# Patient Record
Sex: Male | Born: 1985 | Race: White | Hispanic: No | Marital: Married | Smoking: Never smoker
Health system: Southern US, Community
[De-identification: ages and names within clinical notes are randomized; demographics above are authoritative.]

## PROBLEM LIST (undated history)

## (undated) DIAGNOSIS — T7840XA Allergy, unspecified, initial encounter: Secondary | ICD-10-CM

## (undated) HISTORY — PX: FRACTURE SURGERY: SHX138

## (undated) HISTORY — DX: Allergy, unspecified, initial encounter: T78.40XA

---
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
## 2006-09-03 ENCOUNTER — Ambulatory Visit: Payer: Self-pay | Admitting: Family Medicine

## 2006-12-20 ENCOUNTER — Ambulatory Visit: Payer: Self-pay | Admitting: Family Medicine

## 2006-12-20 LAB — CONVERTED CEMR LAB
ALT: 264 units/L — ABNORMAL HIGH (ref 0–40)
AST: 182 units/L — ABNORMAL HIGH (ref 0–37)
Alkaline Phosphatase: 157 units/L — ABNORMAL HIGH (ref 39–117)
BUN: 8 mg/dL (ref 6–23)
Basophils Relative: 0.9 % (ref 0.0–1.0)
Calcium: 9.2 mg/dL (ref 8.4–10.5)
Eosinophil percent: 0.4 % (ref 0.0–5.0)
Eosinophils Relative: 0.4 % (ref 0.0–5.0)
GFR calc non Af Amer: 90 mL/min
Glomerular Filtration Rate, Af Am: 109 mL/min/{1.73_m2}
Lymphocytes Relative: 60.2 % — ABNORMAL HIGH (ref 12.0–46.0)
Platelets: 256 10*3/uL (ref 150–400)
Potassium: 3.5 meq/L (ref 3.5–5.1)
RBC: 3.61 M/uL — ABNORMAL LOW (ref 4.22–5.81)
RDW: 11.6 % (ref 11.5–14.6)
Total Bilirubin: 7.4 mg/dL — ABNORMAL HIGH (ref 0.3–1.2)
Total Protein: 7.1 g/dL (ref 6.0–8.3)
WBC: 12.7 10*3/uL — ABNORMAL HIGH (ref 4.5–10.5)

## 2006-12-26 ENCOUNTER — Ambulatory Visit: Payer: Self-pay | Admitting: Family Medicine

## 2006-12-26 LAB — CONVERTED CEMR LAB
AST: 51 units/L — ABNORMAL HIGH (ref 0–37)
Basophils Absolute: 0.1 10*3/uL (ref 0.0–0.1)
Bilirubin, Direct: 0.7 mg/dL — ABNORMAL HIGH (ref 0.0–0.3)
HCT: 36.5 % — ABNORMAL LOW (ref 39.0–52.0)
Hemoglobin: 12.3 g/dL — ABNORMAL LOW (ref 13.0–17.0)
Hep A IgM: NEGATIVE
Hep B S Ab: POSITIVE — AB
MCHC: 33.6 g/dL (ref 30.0–36.0)
Monocytes Absolute: 1.9 10*3/uL — ABNORMAL HIGH (ref 0.2–0.7)
Neutrophils Relative %: 11.5 % — ABNORMAL LOW (ref 43.0–77.0)
RDW: 13.1 % (ref 11.5–14.6)
Total Bilirubin: 1.8 mg/dL — ABNORMAL HIGH (ref 0.3–1.2)
Total Protein: 8 g/dL (ref 6.0–8.3)

## 2007-01-03 ENCOUNTER — Ambulatory Visit: Payer: Self-pay | Admitting: Family Medicine

## 2007-01-04 ENCOUNTER — Ambulatory Visit: Payer: Self-pay | Admitting: Internal Medicine

## 2007-01-04 ENCOUNTER — Encounter (INDEPENDENT_AMBULATORY_CARE_PROVIDER_SITE_OTHER): Payer: Self-pay | Admitting: Family Medicine

## 2007-01-04 LAB — CONVERTED CEMR LAB
Basophils Relative: 2 % — ABNORMAL HIGH (ref 0–1)
Eosinophils Absolute: 0 10*3/uL (ref 0.0–0.7)
Eosinophils Relative: 1 % (ref 0–5)
HCT: 43.9 % (ref 39.0–52.0)
Lymphs Abs: 3.8 10*3/uL — ABNORMAL HIGH (ref 0.7–3.3)
MCHC: 30.8 g/dL (ref 30.0–36.0)
MCV: 101.4 fL — ABNORMAL HIGH (ref 78.0–100.0)
Monocytes Relative: 13 % — ABNORMAL HIGH (ref 3–11)
RBC: 4.33 M/uL (ref 4.22–5.81)
WBC: 5.1 10*3/uL (ref 4.0–10.5)

## 2007-01-10 ENCOUNTER — Ambulatory Visit: Payer: Self-pay | Admitting: Family Medicine

## 2007-01-10 LAB — CONVERTED CEMR LAB
ALT: 41 units/L — ABNORMAL HIGH (ref 0–40)
AST: 30 units/L (ref 0–37)
Bilirubin, Direct: 0.3 mg/dL (ref 0.0–0.3)
Total Bilirubin: 0.7 mg/dL (ref 0.3–1.2)
Total Protein: 7.8 g/dL (ref 6.0–8.3)

## 2007-01-28 ENCOUNTER — Ambulatory Visit: Payer: Self-pay | Admitting: Gastroenterology

## 2007-01-28 LAB — CONVERTED CEMR LAB
Ceruloplasmin: 31 mg/dL (ref 21–63)
Ferritin: 33.6 ng/mL (ref 22.0–322.0)
HCV Ab: NEGATIVE
Hep B S Ab: POSITIVE — AB
Hepatitis B Surface Ag: NEGATIVE

## 2007-01-29 ENCOUNTER — Encounter: Payer: Self-pay | Admitting: Gastroenterology

## 2007-03-12 ENCOUNTER — Ambulatory Visit: Payer: Self-pay | Admitting: Gastroenterology

## 2008-05-05 IMAGING — CT CT ABDOMEN W/ CM
2 of 5 series · 17 of 46 positions shown, 19 images · IV contrast (omnipaque)
Comparison: None.

CLINICAL DATA: Fatigue, weight loss.  Elevated LFTs. 
 ABDOMEN CT WITH CONTRAST:
TECHNIQUE: Multidetector CT imaging of the abdomen was performed following the standard protocol during bolus administration of intravenous contrast.
 Contrast:  100 cc Omnipaque 300.
TECHNIQUE: Multidetector CT imaging of the pelvis was performed following the standard protocol during bolus administration of intravenous contrast.

[Series 2: abd_pel 5.0 b30f st · axial · 0.64mm/px · z∈[-443,-73]mm · 14 of 84 slices shown, 16 images]
[im 5/84  soft-tissue]
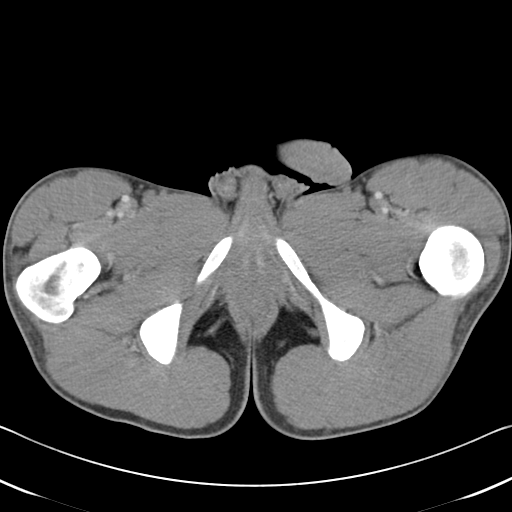
[im 5/84  bone]
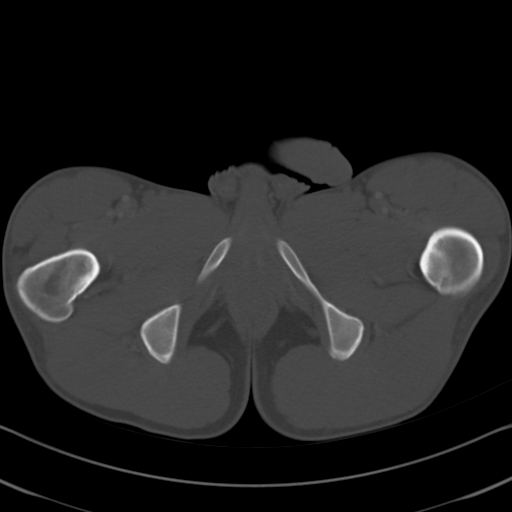
[im 10/84  soft-tissue]
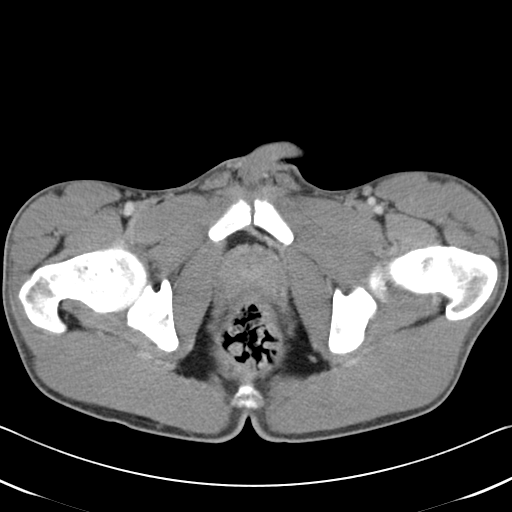
[im 19/84  soft-tissue]
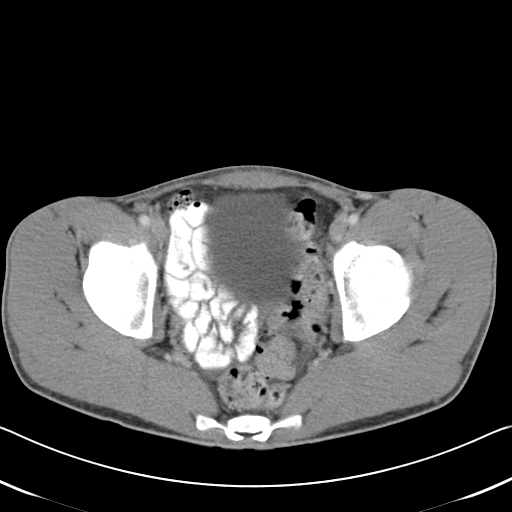
[im 24/84  soft-tissue]
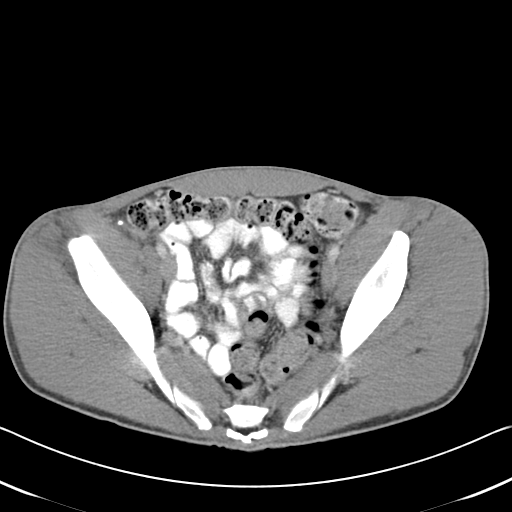
[im 28/84  soft-tissue]
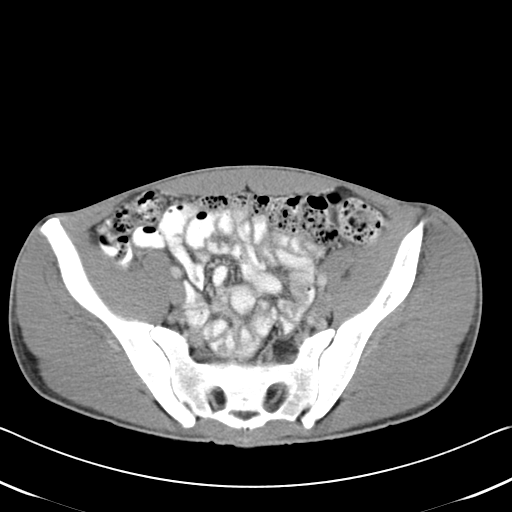
[im 33/84  soft-tissue]
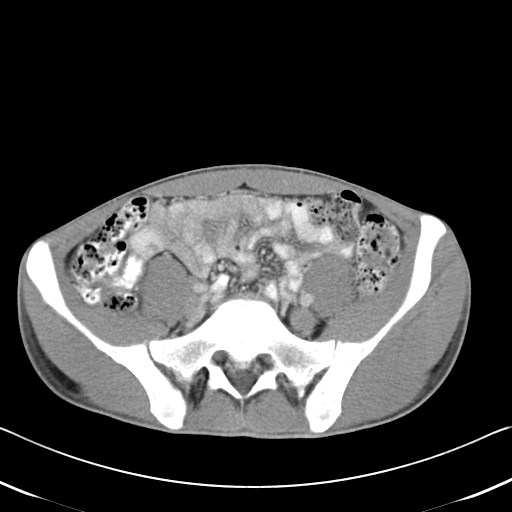
[im 37/84  soft-tissue]
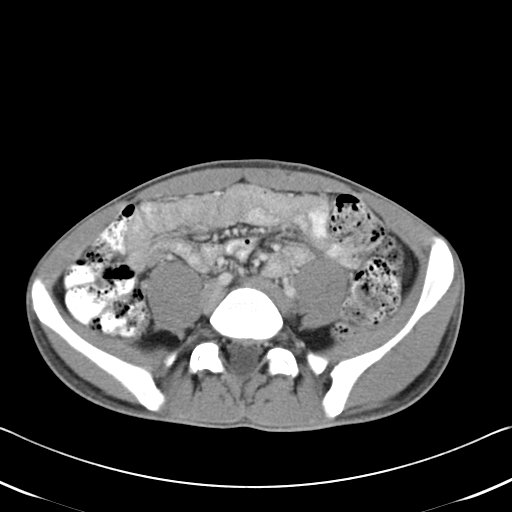
[im 47/84  soft-tissue]
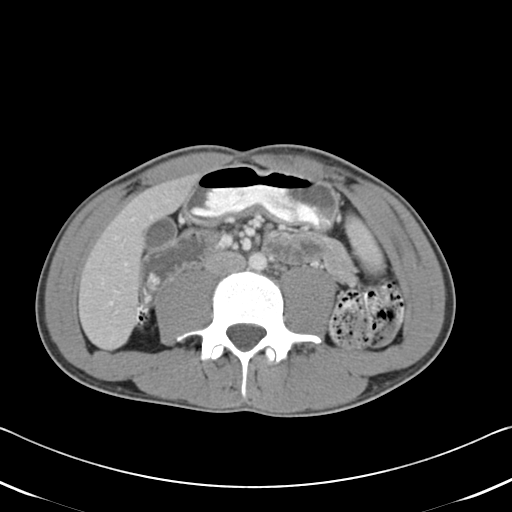
[im 51/84  soft-tissue]
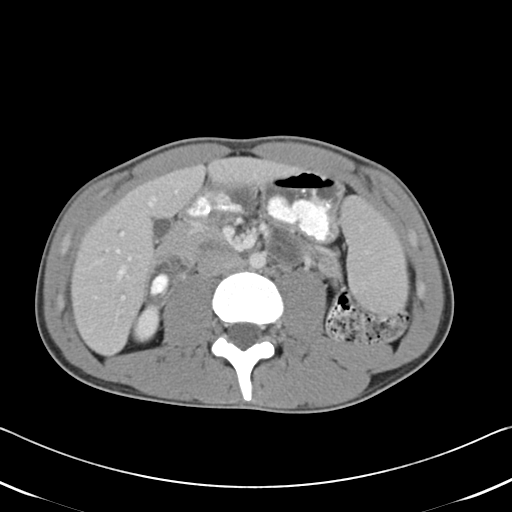
[im 51/84  bone]
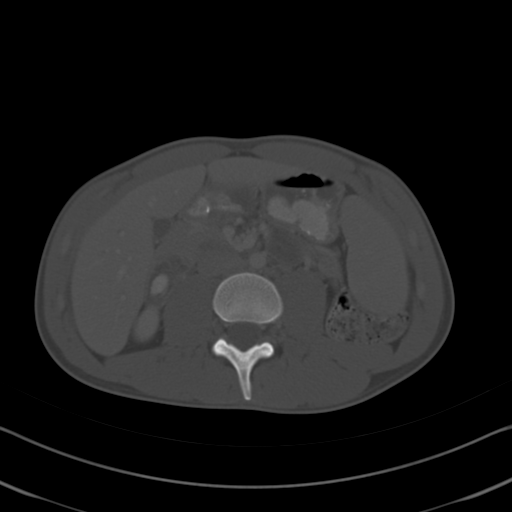
[im 56/84  soft-tissue]
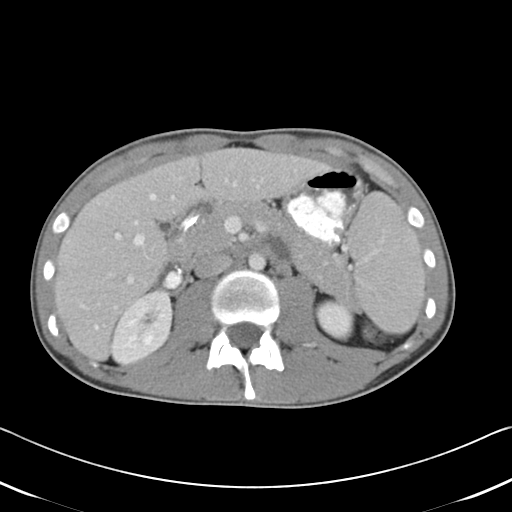
[im 60/84  soft-tissue]
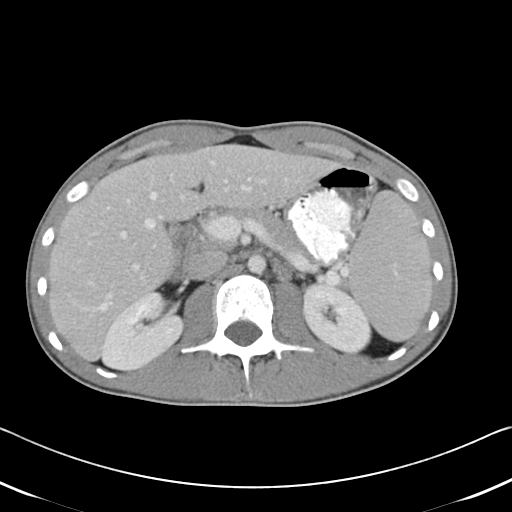
[im 65/84  soft-tissue]
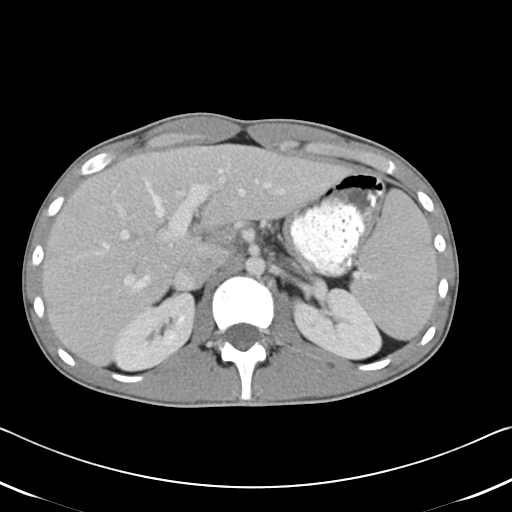
[im 74/84  soft-tissue]
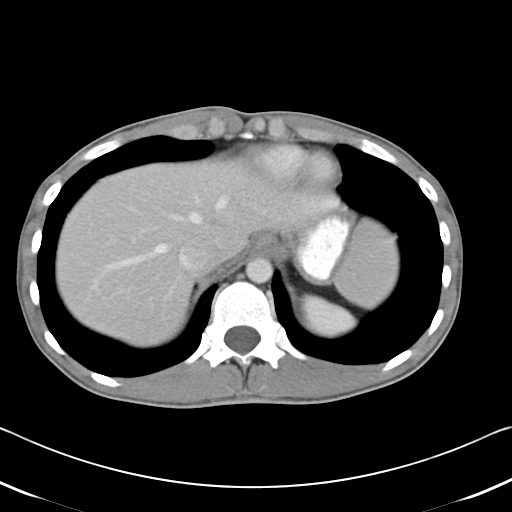
[im 79/84  soft-tissue]
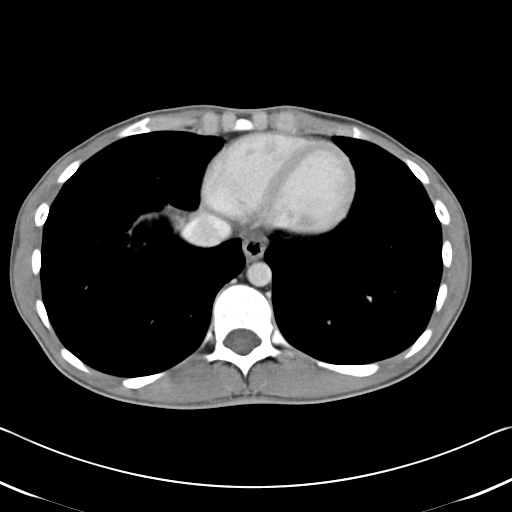

[Series 602: <mpr thick range> · coronal · 0.83mm/px · 3 of 68 slices shown]
[im 23/68  soft-tissue]
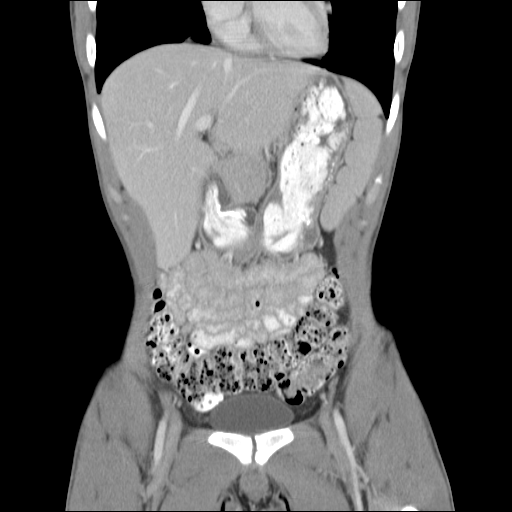
[im 30/68  soft-tissue]
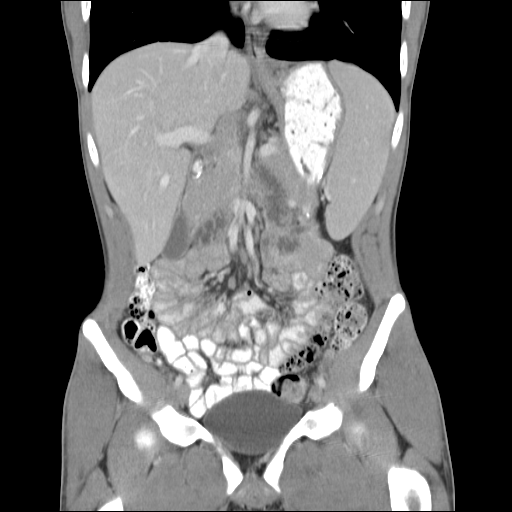
[im 38/68  soft-tissue]
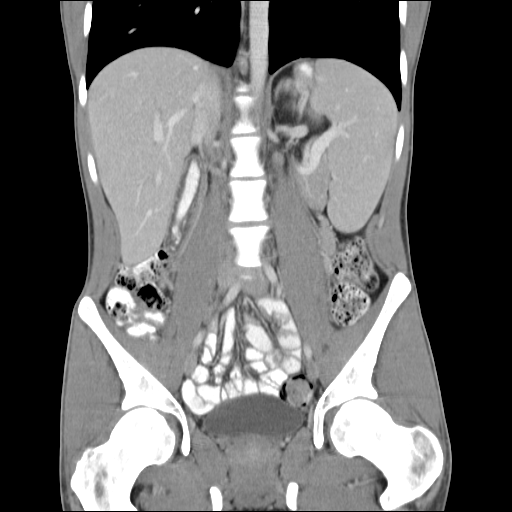

[17 of 46 positions shown; findings below may reference images not displayed]

FINDINGS: The lung bases are clear.  The liver enhances with no focal abnormality and no ductal dilatation is seen.  No calcified gallstones are noted.  The pancreas is normal in size with normal peripancreatic fat planes. The spleen is minimally prominent with no focal abnormality.  The adrenal glands appear normal.  The kidneys enhance and on delayed images, the pelvocaliceal systems appear normal.  The abdominal aorta is normal in caliber and no adenopathy is seen.
IMPRESSION: Negative CT of the abdomen.  No mass or adenopathy.  
 PELVIS CT WITH CONTRAST:
FINDINGS: The urinary bladder is unremarkable.  A small amount of free fluid is noted layering in the pelvis of questionable significance.  There is no evidence of bowel wall thickening and no other evidence of soft tissue edema is seen.  There is a moderate amount of feces throughout the colon.  The appendix coils upon itself in the right lower pelvis anteriorly and does appear to contain an appendicolith.   There is no evidence of acute appendicitis but with the fluid in the pelvis, clinical correlation is recommended.
IMPRESSION: Small amount of free fluid in the pelvis of uncertain significance.  The appendix appears normal in size but there is an appendicolith present.  Suggest clinical correlation.

## 2009-02-12 ENCOUNTER — Encounter: Payer: Self-pay | Admitting: Internal Medicine

## 2009-02-18 ENCOUNTER — Ambulatory Visit: Payer: Self-pay | Admitting: Internal Medicine

## 2009-02-18 DIAGNOSIS — R079 Chest pain, unspecified: Secondary | ICD-10-CM | POA: Insufficient documentation

## 2009-02-18 DIAGNOSIS — J930 Spontaneous tension pneumothorax: Secondary | ICD-10-CM

## 2009-02-18 DIAGNOSIS — J939 Pneumothorax, unspecified: Secondary | ICD-10-CM | POA: Insufficient documentation

## 2009-02-18 DIAGNOSIS — R1312 Dysphagia, oropharyngeal phase: Secondary | ICD-10-CM

## 2009-02-19 ENCOUNTER — Encounter: Payer: Self-pay | Admitting: Internal Medicine

## 2009-02-19 ENCOUNTER — Ambulatory Visit: Payer: Self-pay | Admitting: Cardiovascular Disease

## 2009-02-22 ENCOUNTER — Ambulatory Visit: Payer: Self-pay | Admitting: Internal Medicine

## 2009-03-31 ENCOUNTER — Ambulatory Visit: Payer: Self-pay | Admitting: Internal Medicine

## 2009-03-31 DIAGNOSIS — L03319 Cellulitis of trunk, unspecified: Secondary | ICD-10-CM

## 2009-03-31 DIAGNOSIS — L02219 Cutaneous abscess of trunk, unspecified: Secondary | ICD-10-CM

## 2009-04-02 ENCOUNTER — Encounter (INDEPENDENT_AMBULATORY_CARE_PROVIDER_SITE_OTHER): Payer: Self-pay | Admitting: *Deleted

## 2010-10-09 ENCOUNTER — Emergency Department: Payer: Self-pay | Admitting: Emergency Medicine

## 2010-10-09 ENCOUNTER — Encounter: Payer: Self-pay | Admitting: Gastroenterology

## 2010-10-11 ENCOUNTER — Telehealth: Payer: Self-pay | Admitting: Gastroenterology

## 2010-10-11 ENCOUNTER — Ambulatory Visit: Payer: Self-pay | Admitting: Gastroenterology

## 2010-10-11 DIAGNOSIS — R1084 Generalized abdominal pain: Secondary | ICD-10-CM | POA: Insufficient documentation

## 2011-01-10 NOTE — Progress Notes (Signed)
Summary: recommendation request  Phone Note Call from Patient Call back at [phone]   Caller: Patient Call For: Dr. Jarold Motto Reason for Call: Talk to Nurse Summary of Call: has been diagnosed with a small piece of metal in intestines and will need surgery to have it removed... pt would like Dr. Norval Gable recommendation of a surgeon Initial call taken by: Vallarie Mare,  October 11, 2010 10:53 AM  Follow-up for Phone Call        2.7 cm piece of metal in his duodenum on x-ray from 10/09/10.  He was drinking a milkshake and shortly afterward had abdominal pain and went to [facility].  They told him he needed to see a surgeon and sent him home. Discussed with Dr Jarold Motto patient to come today and get a KUB and to see current location of metal.  He is still having abdominal pain, located at the level of the naval and to the left.  he will come at noon to get KUB Follow-up by: Darcey Nora RN, CGRN,  October 11, 2010 11:37 AM  New Problems: ABDOMINAL PAIN, GENERALIZED (ICD-789.07)   New Problems: ABDOMINAL PAIN, GENERALIZED (ICD-789.07)

## 2011-04-28 NOTE — Assessment & Plan Note (Signed)
[facility]                         GASTROENTEROLOGY OFFICE NOTE   SHMUEL, GIRGIS                    MRN:          [id number]  DATE:01/28/2006                            DOB:          02-Aug-1986    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 Blossom Hoops.   In early January, [name] developed some fatigue, general malaise and  enlarged lymph node in his left neck area. He was seen by Dr. Blossom Hoops  and found to have acute mononucleosis and had rather markedly abnormal  liver function tests with associated jaundice. At that time, his liver  enzymes showed a cholestatic hepatitis with a bilirubin of 7.4 mg  percent, alkaline phosphatase 157, SGOT 182, SGPT of 264. Repeat liver  function tests on January 10, 2007, were essentially normal except for  an SGPT of 41. He has had a persistent mild anemia with a hemoglobin of  around 12, but normal chromic, normocytic indices. The patient had CT  scan of the abdomen performed on January 04, 2007, which showed an  appendolith in the appendix, but otherwise was unremarkable. There was  borderline splenomegaly. The liver appeared normal and there was no  evidence of intrahepatic masses.   Tyler Schroeder now feels well and denies any complaints. He still has a  swollen node in his left neck, but it is has decreased considerably. He  has had no arthralgias, other skin rashes, nausea, vomiting or other GI  complaints. He has regular bowel movements without a history of melena  or hematochezia.   PAST MEDICAL HISTORY:  Otherwise, is noncontributory. He does have a  history of bilateral pneumothoraxes in the past, etiology unclear.   FAMILY HISTORY:  Remarkable for diabetes and atherosclerosis in several  family members. Has no known history of gastrointestinal problems except  a grandmother that apparently did  have some polyps.   MEDICATIONS:  None.   ALLERGIES:  No allergies.   SOCIAL HISTORY:  The patient is single and is in college. He lives with  his parents. He does not smoke. Uses ethanol socially. He denies IV drug  use. He has never had previous episodes of hepatitis or yellow jaundice.   REVIEW OF SYSTEMS:  Otherwise, currently noncontributory.   PHYSICAL EXAMINATION:  He is a healthy-appearing young white male in no  acute distress. Appears his stated age. I cannot appreciate jaundice or  other stigmata of chronic liver disease. He is 5 feet, 10 inches tall  and weighs 129 pounds. Blood pressure is 102/70, pulse 60 and regular.  There was a 2-cm lymph node in the left anterior cervical area that was  freely movable. I could not appreciate any other lymphadenopathy or  thyromegaly.  CHEST: Was entirely clear.  He was in a regular rhythm without murmurs, gallops or rubs.  I could not appreciate hepatosplenomegaly, abdominal masses or  tenderness. Bowel sounds were normal.  Peripheral extremities were unremarkable.  Mental status was normal.   ASSESSMENT:  1. Resolving mononucleosis with associated cholestatic hepatitis.  2. History  of bilateral pneumothoraxes-rule out alpha 1 antitrypsin      deficiency.  3. Mild anemia of unexplained etiology with on reviewing his lab data      further with slightly increased MCV of 97, certainly suggestive of      possible B12 or folate deficiency.   RECOMMENDATIONS:  1. Check B12 and folate levels.  2. Check alpha 1 antitrypsin level, ceruloplasmin, and acute hepatitis      serologies.  3. Repeat liver enzymes in two weeks time with office followup in one      months' time.  4. Will keep an eye on the enlarged lymph node in his neck, which      should go away. If not, it needs to be biopsied.  5. The patient is advised to avoid ethanol as much as possible until      his liver enzymes have completely normalized, although I do not       think he has a problem with alcohol abuse.     Vania Rea. Jarold Motto, MD, Caleen Essex, FAGA  Electronically Signed    DRP/MedQ  DD: 01/28/2007  DT: 01/28/2007  Job #: [id number]   cc:   Leanne Chang, M.D.

## 2011-04-28 NOTE — Assessment & Plan Note (Signed)
[facility]                        [place] OFFICE NOTE   Tyler Schroeder, Tyler Schroeder                    MRN:          [id number]  DATE:01/10/2007                            DOB:          1986/02/16    Tyler Schroeder is a 25 year old male who was diagnosed with mononucleosis  several weeks ago.  During the evaluation, he was found to have elevated  LFTs.  He had additional evaluation, including hepatitis panel and a CT  of the abdomen.  Clinically, the patient is back to baseline and feels  overall well.  He is here today to discuss the findings of the  laboratory data and CT scan.  The mother is also present.   MEDICATIONS:  None.   ALLERGIES:  No known drug allergies.   OBJECTIVE:  VITAL SIGNS:  Weight 125.8, temperature 97.9, pulse 60,  blood pressure 108/70.  No examination was performed today.   LABORATORY DATA:  Recent CBC showed a white count of 5.1, compared to a  previous one of 14.4.  Atypical lymphocytes were noted. AST and ALT done  on December 27, 2006 were 168 and 51, respectively, down from 182 and 264  previously on December 21, 2006.   IMAGING STUDIES:  CT scan was significant for no acute process in the  abdomen.  Pelvic CT showed a small amount of fluid.  Appendix was within  normal size with an appendicolith.  The rest of the pelvic CT was  unremarkable.   IMPRESSION:  A 25 year old male with mononucleosis, now asymptomatic.  Had elevation of liver function tests, which are trending down.  He had  a CT scan which was unremarkable except for a small amount of fluid in  the pelvis, most likely due to inflammatory process of the liver from  Mononucleosis.   PLAN:  1. Discussed the above with both patient and mother.  Agreed to      monitor the liver function tests until it trends down to normal.      Of note, mother did state that Tyler Schroeder has had intermittent      episodes of vomiting throughout the fall.  Tyler Schroeder was not at  all      concerned but did agree.  Given this revelation, I did recommend a      GI consultation.  2. In regards to the free fluid noted in the pelvis, recommended a      follow-up CT in the next couple of months.  Additionally, while      Tyler Schroeder was in the room on his own, I recommended an HIV test, as      he has been sexually active in the past.  3. Tyler Schroeder is to follow up with me in six weeks or sooner if any      symptoms recur.     Leanne Chang, M.D.  Electronically Signed   LA/MedQ  DD: 01/10/2007  DT: 01/10/2007  Job #: [id number]

## 2011-04-28 NOTE — H&P (Signed)
NAME:  KIZER, NOBBE                       ACCOUNT NO.:  [id number]   MEDICAL RECORD NO.:  [id number]                   PATIENT TYPE:  INP   LOCATION:  5734                                 FACILITY:  [facility]   PHYSICIAN:  Gwenith Daily. Tyrone Sage, M.D.            DATE OF BIRTH:  [birth year]   DATE OF ADMISSION:  04/23/2003  DATE OF DISCHARGE:                                HISTORY & PHYSICAL   CHIEF COMPLAINT:  Shortness of breath and right chest pain.   HISTORY OF PRESENT ILLNESS:  This is an 25 year old Caucasian man referred  from the emergency department at [facility] for evaluation of a  right pneumothorax.  He originally presented to the emergency department  yesterday, Apr 23, 2003, with complaints of right upper chest pain as well  as right shoulder pain that extended to reach the shoulder blade.  He was  also short of breath at that time.  A chest x-ray was taken which revealed a  5% right pneumothorax.  He was discharged home with instructions to return  this morning for a repeat chest x-ray.  He did so and chest x-ray revealed  enlargement of his pneumothorax to 30%.  Dr. Gwenith Daily. Tyrone Sage was called  and he examined the patient in the emergency department.  After examination  of patient and review of all the chest films, Dr. Tyrone Sage recommended  placement of a right chest tube.  This procedure was discussed with the  patient and his mom and they agreed to proceed.  Dr. Tyrone Sage placed a 26-  French chest tube in his right anterior chest and was connected to a Pleur-  evac.  He tolerated the procedure well and a followup chest x-ray was taken.  Dr. Tyrone Sage further discussed with patient and his mother indications for  treatment of recurrent pneumothorax with video-assisted thoracoscopy, if  this pneumothorax does not resolve.   PAST MEDICAL HISTORY:  Past medical history significant only for spontaneous  left pneumothorax, June 2003, which resolved without need of  a chest tube.  He has also had a fracture of his left leg when he was 25 years old.   ALLERGIES:  He has no known drug allergies.   MEDICATIONS PRIOR TO ADMISSION:  He occasionally takes MOTRIN.   SOCIAL HISTORY:  He is a single young man.  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  He  lives at home with his mom and dad and one younger brother.  He does drive  and his mom and dad will both be available for assistance after discharge  from the hospital.  He does not use tobacco or alcohol.   FAMILY HISTORY:  Family history significant for grandmother and grandfather  with heart disease.   REVIEW OF SYSTEMS:  Review of systems is essentially negative except for  some right upper chest pain  with deep breathing.   PHYSICAL EXAMINATION:  VITAL SIGNS:  Blood pressure 121/44, heart rate 67,  O2 saturation 99%.  He is 5-feet 10-inches tall and weighs 125 pounds.  GENERAL:  In general, this is an 25 year old Caucasian man in no acute  distress, resting comfortably in his hospital bed.  HEENT:  Eyes:  PERRLA, EOMI.  His oral mucosa is pink and moist.  His  dentition is in good repair.  NECK:  His neck is with a full range of motion, no carotid bruit,  thyromegaly or lymphadenopathy.  CHEST:  His chest shows unlabored breathing.  His breath sounds are slightly  decreased on the right, no wheeze or rhonchi noted.  His right chest tube is  intact and it is connected to a Pleur-evac and wall suction.  There is no  air leak and no drainage.  HEART:  His heart is regular rate and rhythm.  ABDOMEN:  Abdomen is flat, soft and nontender.  There are positive bowel  sounds.  GU AND RECTAL:  Exams deferred.  EXTREMITIES:  His lower extremities are without edema or varicosities or  venous stasis changes.  NEUROLOGIC:  Neurologically, he is alert and oriented, cranial nerves II-XII  are grossly intact.  He has full range of motion in his  upper and lower  extremities bilaterally.  Muscle strength is 5/5 in the upper and lower  extremities bilaterally.   IMPRESSION:  This is an 25 year old male with spontaneous right  pneumothorax, status post placement of right chest tube.   PLAN:  Admit to [facility] under the care of Dr. Sheliah Plane  for chest tube management and daily chest x-rays.  If his pneumothorax does  not resolve or if it recurs, he may undergo right video-assisted  thoracoscopy.     Toribio Harbour, N.P.                  Gwenith Daily Tyrone Sage, M.D.    CTK/MEDQ  D:  04/23/2003  T:  04/24/2003  Job:  [id number]

## 2011-04-28 NOTE — Discharge Summary (Signed)
NAME:  Tyler Schroeder, Tyler Schroeder                       ACCOUNT NO.:  [id number]   MEDICAL RECORD NO.:  [id number]                   PATIENT TYPE:  INP   LOCATION:  5734                                 FACILITY:  [facility]   PHYSICIAN:  Gwenith Daily. Tyrone Sage, M.D.            DATE OF BIRTH:  03/13/1986   DATE OF ADMISSION:  04/23/2003  DATE OF DISCHARGE:  04/29/2003                                 DISCHARGE SUMMARY   ADMITTING DIAGNOSIS:  Right spontaneous pneumothorax.   PAST MEDICAL HISTORY:  1. Spontaneous left pneumothorax, June 2003, resolved without need of a     chest tube.  2. Fracture, left leg, when he was 25 years old.   ALLERGIES:  Patient has no known drug allergies.   BRIEF HISTORY:  Tyler Schroeder is an 25 year old Caucasian male.  He presented  to the emergency department at [facility] on May 13 with complaints of right  upper chest pain as well as shoulder pain, extending to his shoulder blade.  He was also short of breath.  Chest x-ray revealed a 5% right pneumothorax.  He was discharged home with instructions to return the morning of May 14 for  a repeat chest x-ray.  He did this, and the chest x-ray revealed enlargement  of his pneumothorax to 30%.   HOSPITAL COURSE:  Apr 23, 2003, Dr. Sheliah Plane examined Tyler Schroeder in  the emergency department.  He also reviewed the chest x-ray.  His impression  was that of a spontaneous right pneumothorax requiring chest tube placement.  The procedure, risks and benefits were discussed with Tyler Schroeder and his  mother, and they agreed to proceed with the chest tube placement.   Dr. Tyrone Sage placed a 20-inch chest tube in his right anterior chest and  connected to a Pleur-evac suction system.  He tolerated the procedure well,  and he was admitted to the hospital for chest tube management and serial  chest x-rays.   Chest x-ray, on May 14, revealed no pneumothorax; however, he did have a  small air leak.  He had an intermittent air leak  over the next 48 hours.   The afternoon of May 17, he did have an air leak.  Dr. Tyrone Sage again  discussed with the [facility] the preceding was [facility]ATS for treatment of blood.  They agreed that if he continued to have an air leak __________ morning that  they would proceed with this.   Morning of  May 18, he had no air leak.  His chest x-ray was stable.  Dr.  Tyrone Sage reviewed the chest x-ray, which revealed less than 5% pneumothorax.  Chest x-ray showed no air leak, and he was completely asymptomatic from this  pneumothorax.  It was decided to remove his chest tube and follow up with a  chest x-ray in the morning.   The morning of May 19, his chest x-ray was stable.  His breathing remained  nonlabored.  His breath sounds were equal bilaterally.  His chest tube site  is clean and dry.    FOLLOW UP:  Tyler Schroeder will be discharged home today with instructions to  follow up with Dr. Tyrone Sage at [facility] office tomorrow.  He will be asked to  have a chest x-ray due to his PVC one hour before his appointment with Dr.  Tyrone Sage.      Toribio Harbour, N.P.                  Gwenith Daily Tyrone Sage, M.D.    CTK/MEDQ  D:  04/29/2003  T:  04/30/2003  Job:  [id number]   cc:   Patient's hospital chart   Tyrone Sage, M.D.  [facility] office   Delorise Jackson, M.D.  [facility]  [place]

## 2012-02-10 IMAGING — CR DG ABDOMEN 1V
1 series · 1 of 1 positions shown · non-contrast
Comparison: None.

CLINICAL DATA: Preop; foreign body in duodenum

ABDOMEN - 1 VIEW

[view not recorded]
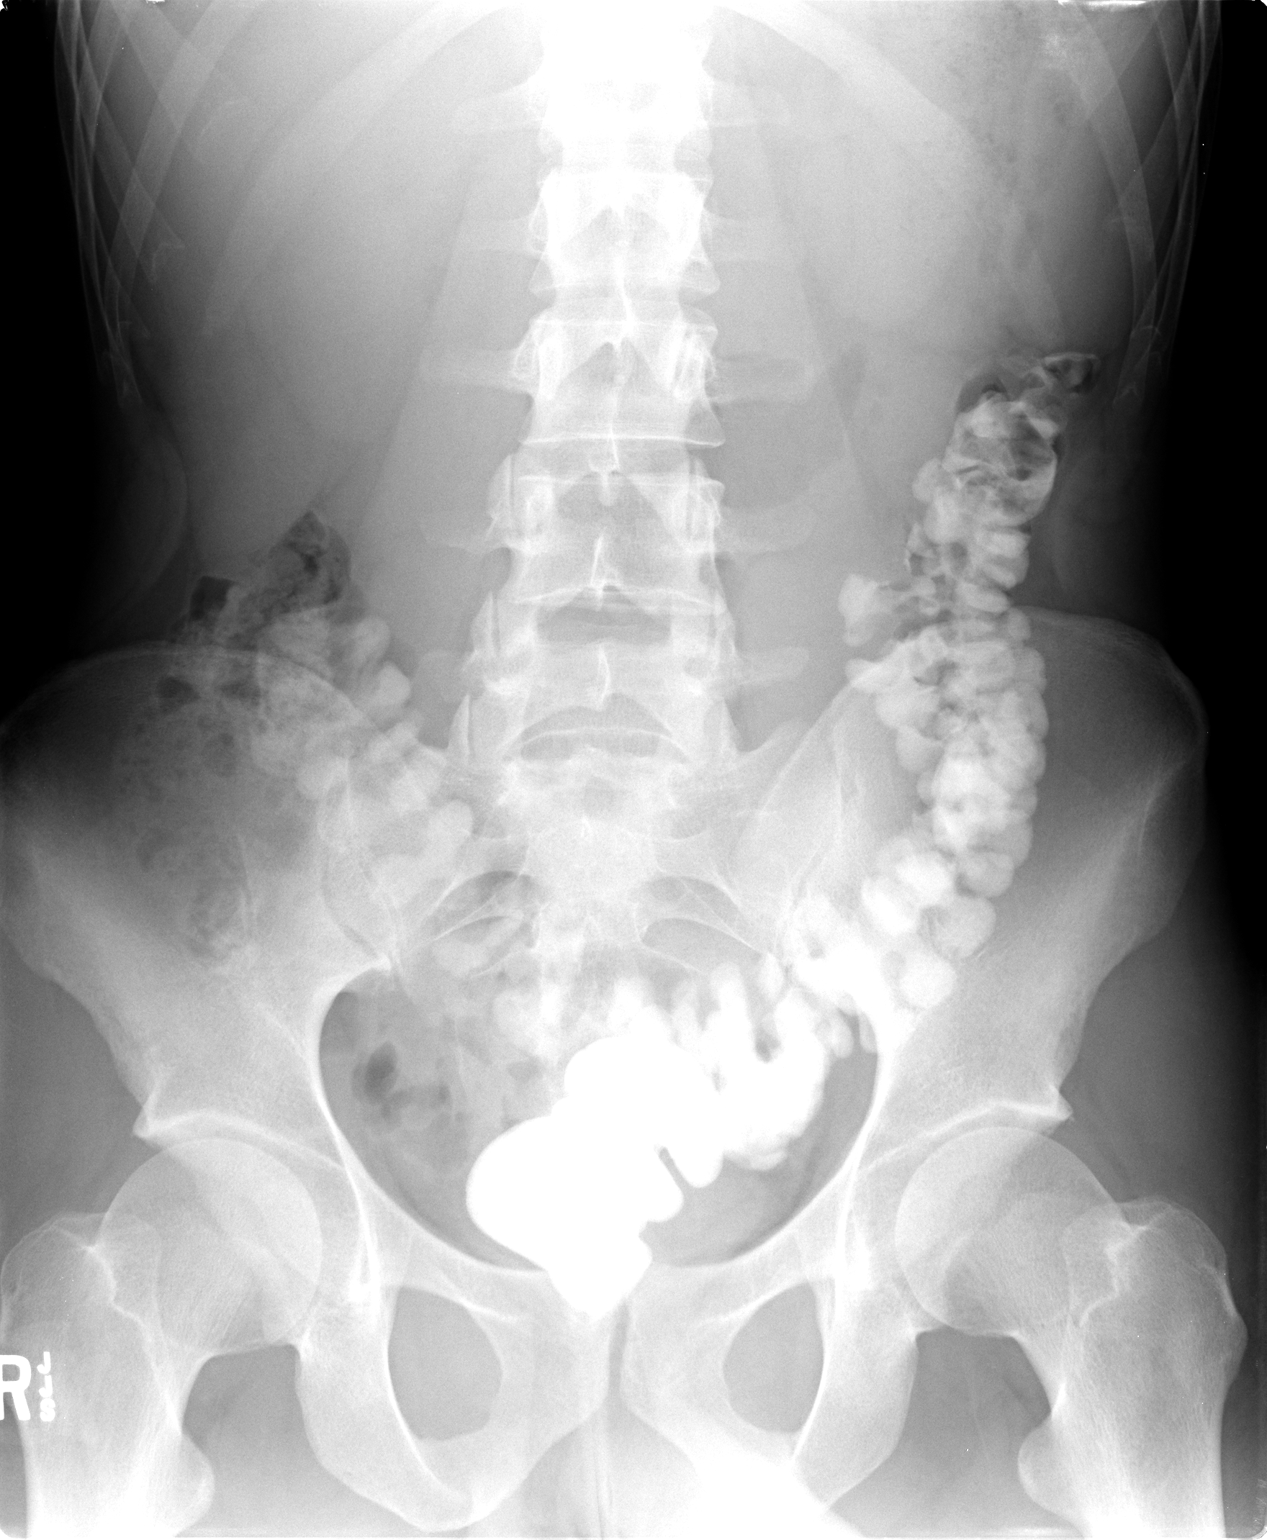

[1 of 1 positions shown; findings below may reference images not displayed]

FINDINGS: The stool and bowel gas pattern is within normal limits.
Contrast material from CT scan performed at another hospital is
present within the mid and distal colon.  There is no gross
organomegaly.  No abnormal calcifications overlie the abdomen or
pelvis.  The osseous structures are unremarkable.  No radio-opaque
foreign body is identified.
IMPRESSION: Normal KUB.

## 2013-03-12 ENCOUNTER — Ambulatory Visit (INDEPENDENT_AMBULATORY_CARE_PROVIDER_SITE_OTHER): Payer: BC Managed Care – PPO | Admitting: Family Medicine

## 2013-03-12 VITALS — BP 110/82 | HR 81 | Temp 98.5°F | Resp 16 | Ht 70.5 in | Wt 156.4 lb

## 2013-03-12 DIAGNOSIS — H6121 Impacted cerumen, right ear: Secondary | ICD-10-CM

## 2013-03-12 DIAGNOSIS — H6120 Impacted cerumen, unspecified ear: Secondary | ICD-10-CM

## 2013-03-12 NOTE — Progress Notes (Signed)
Chief complaint: Loss of hearing in right ear  Patient is a 27 year old male who over the course the last 3 days has felt like he has lost his hearing in his right ear. Patient has had trouble with your wax before and has been trying to get rid of it but it does not seem to be working. The patient has tried all the home modalities and over-the-counter medications without any help. Patient states that there is no pain and he has had no fevers or chills. Patient denies any dizziness lightheadedness or ringing in the ears. Patient states that it is just difficult to hear on the right side of his head. Patient has not had this problem before but once again has had problems with ear wax.  Past Medical History  Diagnosis Date  . Allergy    Past Surgical History  Procedure Laterality Date  . Fracture surgery     No current outpatient prescriptions on file prior to visit.   No current facility-administered medications on file prior to visit.   Family History  Problem Relation Age of Onset  . Depression Brother   . Anxiety disorder Brother   . Diabetes Paternal Grandmother   . Leukemia Paternal Grandmother    History  Substance Use Topics  . Smoking status: Never Smoker   . Smokeless tobacco: Not on file  . Alcohol Use: 0.5 oz/week    1 drink(s) per week   Physical exam Blood pressure 110/82, pulse 81, temperature 98.5 F (36.9 C), temperature source Oral, resp. rate 16, height 5' 10.5" (1.791 m), weight 156 lb 6.4 oz (70.943 kg), SpO2 100.00%. General: No apparent distress alert and oriented x3 mood and affect normal Respiratory: Patient's speak in full sentences and does not appear short of breath Skin: Warm dry intact with no signs of infection or rash Neuro: Cranial nerves II through XII are intact, neurovascularly intact in all extremities with 2+ DTRs and 2+ pulses. Right ear exam: Patient does have severe cerumen impaction on the right side. Tympanic membrane is visualized on the  left side. Patient did have surrounding removed with Colace and tympanic membrane was visualized. Patient's hearing is intact bilaterally and symmetric. Patient felt much better after this was removed.  Assessment: Cerumen impaction  Plan: As stated above with the procedure. Patient told that he would potentially benefit from prophylactic drops to try to avoid the buildup of the ear wax. The patient will followup only as needed.

## 2013-03-12 NOTE — Patient Instructions (Signed)
Verbal instruction given

## 2015-02-22 ENCOUNTER — Ambulatory Visit (INDEPENDENT_AMBULATORY_CARE_PROVIDER_SITE_OTHER): Payer: BLUE CROSS/BLUE SHIELD | Admitting: Family Medicine

## 2015-02-22 VITALS — BP 138/84 | HR 98 | Temp 98.3°F | Resp 16 | Ht 70.0 in | Wt 159.0 lb

## 2015-02-22 DIAGNOSIS — H6123 Impacted cerumen, bilateral: Secondary | ICD-10-CM | POA: Diagnosis not present

## 2015-02-22 NOTE — Progress Notes (Signed)
   Subjective:  This chart was scribed for Tyler SorensonEva Jessyka Austria MD,  by Tyler FellsHatice Schroeder,scribe, at [facility].  This patient was seen in room 2 and the patient's care was started at 5:40 PM.     Patient ID: Tyler Schroeder Bahri, male    DOB: 05/06/1986, 29 y.o.   MRN: [id number] Chief Complaint  Patient presents with  . Otalgia    Right, X 3 days    HPI  HPI Comments: Tyler Schroeder Baby is a 29 y.o. male who presents to [facility] for ear wax impaction in his right ear onset three days ago and notes that he has tried many different things including hydrogen peroxide but denies any relief.  Patient has associated symptoms of decreased hearing in his ear.  Patient has no other complaints today.    Past Medical History  Diagnosis Date  . Allergy     No current outpatient prescriptions on file prior to visit.   No current facility-administered medications on file prior to visit.    Allergies  Allergen Reactions  . Nsaids Hives and Swelling    Review of Systems  Constitutional: Negative for fever and chills.  HENT: Positive for hearing loss.        Objective:   Physical Exam  Constitutional: He is oriented to person, place, and time. He appears well-developed and well-nourished. No distress.  HENT:  Head: Normocephalic and atraumatic.  Right Ear: External ear normal.  Left Ear: External ear normal.  Nose: Nose normal.  Mouth/Throat: Oropharynx is clear and moist.  Bilateral cerumen obsruction  Eyes: No scleral icterus.  Pulmonary/Chest: Effort normal.  Neurological: He is alert and oriented to person, place, and time.  Skin: Skin is warm and dry. He is not diaphoretic.  Psychiatric: He has a normal mood and affect. His behavior is normal.   BP 138/84 mmHg  Pulse 98  Temp(Src) 98.3 F (36.8 C) (Oral)  Resp 16  Ht 5\' 10"  (1.778 m)  Wt 159 lb (72.122 kg)  BMI 22.81 kg/m2  SpO2 98%      Assessment & Plan:  Cerumen impaction,  bilateral removed by lavage by cma Schroeder/o complication Reviewed trial of ear wax softener - debrox - sev d each mo - try steam treatments, rtc if recurs.   I personally performed the services described in this documentation, which was scribed in my presence. The recorded information has been reviewed and considered, and addended by me as needed.  Tyler SorensonEva Andrus Sharp, MD MPH

## 2015-06-22 ENCOUNTER — Encounter: Payer: Self-pay | Admitting: Internal Medicine

## 2015-06-22 ENCOUNTER — Ambulatory Visit (INDEPENDENT_AMBULATORY_CARE_PROVIDER_SITE_OTHER): Payer: BLUE CROSS/BLUE SHIELD | Admitting: Internal Medicine

## 2015-06-22 VITALS — BP 140/82 | HR 75 | Temp 98.4°F | Resp 14 | Wt 165.0 lb

## 2015-06-22 DIAGNOSIS — T675XXA Heat exhaustion, unspecified, initial encounter: Secondary | ICD-10-CM | POA: Diagnosis not present

## 2015-06-22 NOTE — Progress Notes (Signed)
Pre visit review using our clinic review tool, if applicable. No additional management support is needed unless otherwise documented below in the visit note. 

## 2015-06-22 NOTE — Patient Instructions (Signed)
Drink Gatorade Lite instead of water if having excessive sweating. This will prevent loss of essential  Electrolytes. Low glucose can be from "reactive hypoglycemia", low sugars due to the pancreas excreting excess insulin in response to glucose elevations from " hyperglycemic carbs"  in diet as well as prolonged fasting. Eat a low-fat diet with lots of fruits and vegetables, up to 7-9 servings per day.  White carbohydrates (potatoes, rice, bread, and pasta) have a high spike of sugar and a high load of sugar. For example a  baked potato has a cup of sugar and a  french fry  2 teaspoons of sugar. Yams, wild  rice, whole grained bread &  wheat pasta have been much lower spike and load of  sugar. Portions should be the size of a deck of cards or your palm.

## 2015-06-22 NOTE — Progress Notes (Signed)
   Subjective:    Patient ID: Tyler Schroeder, male    DOB: 03/18/1986, 29 y.o.   MRN: [id number]  HPI He thinks he has had 3 episodes of heat exhaustion since last summer. 2 episodes occurred in May and June of last year. He was active in humidity & heat in the context of a prolonged fast. He had nausea and vomiting followed by weakness and fatigue. Prior to the episodes he also noted tingling over the lateral forearm.  The most recent episode occurred in late June of this year. He had eaten cereal for breakfast and the event occurred approximately 2 PM. He had not had lunch. He was active in 80-90 degree temperatures. He was drinking water at the onset of symptoms. Upon return home he had cheese toast with slow resolution of symptoms.  He denies any fever, chills, sweats, mental confusion, or loss of consciousness. He has no associated cardio vascular symptoms.   Review of Systems  Chest pain, palpitations, tachycardia, exertional dyspnea, paroxysmal nocturnal dyspnea, claudication or edema were absent with episodes.       Objective:   Physical Exam  General appearance is one of good health and nourishment w/o distress.Full moustache & beard  Eyes: No conjunctival inflammation or scleral icterus is present.  Oral exam: Dental hygiene is good; lips and gums are healthy appearing.There is no oropharyngeal erythema or exudate noted.   Heart:  Normal rate and regular rhythm. S1 and S2 normal without gallop, murmur, click, rub or other extra sounds     Lungs:Chest clear to auscultation; no wheezes, rhonchi,rales ,or rubs present.No increased work of breathing.   Abdomen: bowel sounds normal, soft and non-tender without masses, organomegaly or hernias noted.  No guarding or rebound .  Musculoskeletal: Able to lie flat and sit up without help. Negative straight leg raising bilaterally. Gait normal  Skin:Warm & dry.  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  See after visit summary

## 2015-07-02 ENCOUNTER — Encounter: Payer: Self-pay | Admitting: Emergency Medicine

## 2015-07-02 ENCOUNTER — Ambulatory Visit (INDEPENDENT_AMBULATORY_CARE_PROVIDER_SITE_OTHER): Payer: BLUE CROSS/BLUE SHIELD | Admitting: Internal Medicine

## 2015-07-02 ENCOUNTER — Encounter: Payer: Self-pay | Admitting: Internal Medicine

## 2015-07-02 VITALS — BP 148/98 | HR 98 | Temp 99.1°F | Resp 18 | Wt 162.0 lb

## 2015-07-02 DIAGNOSIS — J069 Acute upper respiratory infection, unspecified: Secondary | ICD-10-CM | POA: Diagnosis not present

## 2015-07-02 DIAGNOSIS — B9789 Other viral agents as the cause of diseases classified elsewhere: Principal | ICD-10-CM

## 2015-07-02 MED ORDER — HYDROCODONE-HOMATROPINE 5-1.5 MG/5ML PO SYRP: 5.0000 mL | ORAL_SOLUTION | Freq: Four times a day (QID) | ORAL | Status: AC | PRN

## 2015-07-02 MED ORDER — AZITHROMYCIN 250 MG PO TABS: ORAL_TABLET | ORAL | Status: AC

## 2015-07-02 NOTE — Progress Notes (Signed)
   Subjective:    Patient ID: Tyler Schroeder, male    DOB: 05-18-1986, 29 y.o.   MRN: [id number]  HPI His symptoms began 06/29/15 as rhinitis, sneezing, and malaise. On 7/20 he vomited. He's had temperatures up to 100F. He's also developed a dry cough. All nasal secretions have been clear with no symptoms or signs of rhinosinusitis being present.   Review of Systems Frontal headache, facial pain , nasal purulence, dental pain, sore throat , otic pain or otic discharge denied. No fever , chills or sweats.     Objective:   Physical Exam   General appearance:Adequately nourished; no acute distress or increased work of breathing is present.    Lymphatic: No  lymphadenopathy about the head, neck, or axilla .  Eyes: No conjunctival inflammation or lid edema is present. There is no scleral icterus.  Ears:  External ear exam shows no significant lesions or deformities.  Otoscopic examination reveals clear canals, tympanic membranes are intact bilaterally without bulging, retraction, inflammation or discharge.  Nose:  External nasal examination shows no deformity or inflammation. Nasal mucosa are erythematous and moist without lesions or exudates No septal dislocation or deviation.No obstruction to airflow.   Oral exam: Dental hygiene is good; lips and gums are healthy appearing.There is no oropharyngeal erythema or exudate .  Neck:  No deformities, thyromegaly, masses, or tenderness noted.   Supple with full range of motion without pain.   Heart:  Normal rate and regular rhythm. S1 and S2 normal without gallop, murmur, click, rub or other extra sounds.   Lungs:Chest clear to auscultation; no wheezes, rhonchi,rales ,or rubs present. Intermittent dry cough  Extremities:  No cyanosis, edema, or clubbing  noted    Skin: Warm & dry w/o tenting or jaundice. No significant lesions or rash.       Assessment & Plan:  #1 acute viral respiratory tract infection  See orders and after  visit summary

## 2015-07-02 NOTE — Patient Instructions (Addendum)
Plain Mucinex (NOT D) for thick secretions ;force NON dairy fluids .   Nasal cleansing in the shower as discussed with lather of mild shampoo.After 10 seconds wash off lather while  exhaling through nostrils. Make sure that all residual soap is removed to prevent irritation.  Flonase OR Nasacort AQ 1 spray in each nostril twice a day as needed. Use the "crossover" technique into opposite nostril spraying toward opposite ear @ 45 degree angle, not straight up into nostril.  Plain Allegra (NOT D )  160 daily , Loratidine 10 mg , OR Zyrtec 10 mg @ bedtime  as needed for itchy eyes & sneezing.  Zicam Melts or Zinc lozenges as per package label for any sore throat .  Complementary options to boost immunity include  vitamin C 2000 mg daily; & Echinacea for 4-7 days.  Fill the  prescription for antibiotic ONLY  if discolored nasal secretions with frontal headache or facial pain or discolored chest secretions appear in the next 72 hours.   Tylenol every 4 hrs as needed for fever as discussed based on label recommendations (do not exceed max recommended dose)

## 2015-07-02 NOTE — Progress Notes (Signed)
Pre visit review using our clinic review tool, if applicable. No additional management support is needed unless otherwise documented below in the visit note. 

## 2022-07-08 ENCOUNTER — Encounter (HOSPITAL_BASED_OUTPATIENT_CLINIC_OR_DEPARTMENT_OTHER): Payer: Self-pay | Admitting: Emergency Medicine

## 2022-07-08 ENCOUNTER — Other Ambulatory Visit: Payer: Self-pay

## 2022-07-08 ENCOUNTER — Emergency Department (HOSPITAL_BASED_OUTPATIENT_CLINIC_OR_DEPARTMENT_OTHER)
Admission: EM | Admit: 2022-07-08 | Discharge: 2022-07-09 | Disposition: A | Discharge status: Home / Self Care | Payer: BC Managed Care – PPO | Attending: Emergency Medicine | Admitting: Emergency Medicine

## 2022-07-08 DIAGNOSIS — X58XXXA Exposure to other specified factors, initial encounter: Secondary | ICD-10-CM | POA: Diagnosis not present

## 2022-07-08 DIAGNOSIS — T17208A Unspecified foreign body in pharynx causing other injury, initial encounter: Secondary | ICD-10-CM | POA: Diagnosis present

## 2022-07-08 DIAGNOSIS — K222 Esophageal obstruction: Secondary | ICD-10-CM

## 2022-07-08 DIAGNOSIS — T18128A Food in esophagus causing other injury, initial encounter: Secondary | ICD-10-CM | POA: Diagnosis not present

## 2022-07-08 NOTE — ED Triage Notes (Signed)
Pt states he thinks some of the chicken he had for dinner is stuck since about 2000. Pt points to mid sternum area. He is able to speak clearly , no breathing difficulties. Pt states he tried drinking a carbonated beverage but vomited.

## 2022-07-08 NOTE — Discharge Instructions (Addendum)
Start taking omeprazole or esomeprazole available over-the-counter once daily according to label instructions.  Get rechecked immediately if you develop fevers, difficulty breathing, severe pain or inability to keep down fluids.

## 2022-07-08 NOTE — ED Provider Notes (Signed)
[facility] EMERGENCY DEPARTMENT Provider Note   CSN: [id number] Arrival date & time: 07/08/22  2136     History {Add pertinent medical, surgical, social history, OB history to HPI:1} Chief Complaint  Patient presents with  . Foreign body    Tyler Schroeder is a 36 y.o. male.  The history is provided by the patient.  Tyler Schroeder is a 36 y.o. male who presents to the Emergency Department complaining of foreign body in throat.  He presents to the emergency department for evaluation after getting chicken stuck in his throat.  He states that he was eating dinner around 8 PM and he swallowed a piece of chicken and felt like it got stuck near his xiphoid process.  He was regurgitating after this occurred.  He did try to have a carbonated beverage but this came back up quickly.  He has associated discomfort related to this.  No difficulty breathing.  He has had similar but more mild symptoms in the past that he was able to pass on his own.  While he was awaiting evaluation in the emergency department he went to retch and did not bring up chicken but felt a change in his pain and has not been bringing up saliva since that point in time.     Home Medications Prior to Admission medications   Medication Sig Start Date End Date Taking? Authorizing Provider  azithromycin (ZITHROMAX Z-PAK) 250 MG tablet 2 day 1, then 1 qd 07/02/15   Pecola Lawless, MD  HYDROcodone-homatropine (HYDROMET) 5-1.5 MG/5ML syrup Take 5 mLs by mouth every 6 (six) hours as needed for cough. 07/02/15   Pecola Lawless, MD  Loratadine (CLARITIN PO) Take by mouth.    [provider]  Multiple Vitamin (MULTI-VITAMINS PO) Take by mouth.    [provider]      Allergies    Nsaids    Review of Systems   Review of Systems  All other systems reviewed and are negative.   Physical Exam Updated Vital Signs BP (!) 147/105 (BP Location: Right Arm)   Pulse 80   Temp 98.1 F (36.7 C)  (Oral)   Resp 18   Ht 5\' 10"  (1.778 m)   Wt 66.7 kg   SpO2 97%   BMI 21.09 kg/m  Physical Exam Vitals and nursing note reviewed.  Constitutional:      Appearance: He is well-developed.  HENT:     Head: Normocephalic and atraumatic.  Cardiovascular:     Rate and Rhythm: Normal rate and regular rhythm.     Heart sounds: No murmur heard. Pulmonary:     Effort: Pulmonary effort is normal. No respiratory distress.     Breath sounds: Normal breath sounds.  Abdominal:     Palpations: Abdomen is soft.     Tenderness: There is no abdominal tenderness. There is no guarding or rebound.  Musculoskeletal:        General: No tenderness.  Skin:    General: Skin is warm and dry.  Neurological:     Mental Status: He is alert and oriented to person, place, and time.  Psychiatric:        Behavior: Behavior normal.    ED Results / Procedures / Treatments   Labs (all labs ordered are listed, but only abnormal results are displayed) Labs Reviewed - No data to display  EKG None  Radiology No results found.  Procedures Procedures  {Document cardiac monitor, telemetry assessment procedure when appropriate:1}  Medications Ordered in ED Medications - No data to display  ED Course/ Medical Decision Making/ A&P                           Medical Decision Making  Patient here for evaluation after chicken getting lodged in his throat.  He did feel like this passed while he was awaiting evaluation in the emergency department.  He was provided with a carbonated beverage and able to drink this without difficulty.  Discussed with patient esophageal food impaction precautions with recommendation to start PPI with GI follow-up.  Return precautions discussed.  {Document critical care time when appropriate:1} {Document review of labs and clinical decision tools ie heart score, Chads2Vasc2 etc:1}  {Document your independent review of radiology images, and any outside records:1} {Document your  discussion with family members, caretakers, and with consultants:1} {Document social determinants of health affecting pt's care:1} {Document your decision making why or why not admission, treatments were needed:1} Final Clinical Impression(s) / ED Diagnoses Final diagnoses:  None    Rx / DC Orders ED Discharge Orders     None

## 2022-07-09 NOTE — ED Notes (Addendum)
Patient drinking soda beverage and reports he was able to tolerate soda without vomiting and that he believes "the chicken has passed."

## 2023-01-01 ENCOUNTER — Ambulatory Visit: Payer: BC Managed Care – PPO | Admitting: Family Medicine
